# Patient Record
Sex: Female | Born: 1995
Health system: Southern US, Community
[De-identification: ages and names within clinical notes are randomized; demographics above are authoritative.]

## PROBLEM LIST (undated history)

## (undated) DIAGNOSIS — T7840XA Allergy, unspecified, initial encounter: Secondary | ICD-10-CM

## (undated) DIAGNOSIS — IMO0001 Reserved for inherently not codable concepts without codable children: Secondary | ICD-10-CM

## (undated) HISTORY — DX: Reserved for inherently not codable concepts without codable children: IMO0001

## (undated) HISTORY — DX: Allergy, unspecified, initial encounter: T78.40XA

---
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
## 2016-04-28 ENCOUNTER — Ambulatory Visit (INDEPENDENT_AMBULATORY_CARE_PROVIDER_SITE_OTHER): Payer: BLUE CROSS/BLUE SHIELD | Admitting: Family Medicine

## 2016-04-28 ENCOUNTER — Encounter: Payer: Self-pay | Admitting: Family Medicine

## 2016-04-28 VITALS — BP 100/60 | HR 90 | Wt 132.0 lb

## 2016-04-28 DIAGNOSIS — M25561 Pain in right knee: Secondary | ICD-10-CM

## 2016-04-28 NOTE — Progress Notes (Signed)
   Subjective:    Patient ID: Serena CroissantSusan Hixon, female    DOB: Jul 22, 1995, 21 y.o.   MRN: [id number]  HPI She is here for evaluation of right knee pain. She states that she has had intermittent difficulty with knee pain since age 21 but not significant enough for her to have seen help. Approximately 2 weeks ago she noted increased pain to the anterolateral area of her knee. She noted initially when she had her knees slightly bent while doing dance classes. She notes increased pain going down the hill but does not note same issue going down a hill or with sitting for long periods of time. She states she has no difficulty with jumping. She states that it does cause some popping and grinding but no locking.   Review of Systems     Objective:   Physical Exam Full motion of the right knee. No effusion noted. No tenderness over the inferior pole of the patella or the tibial tubercle. No tenderness to the medial or lateral aspect of the patella. Compression testing was negative. No joint line tenderness. Medial and lateral collateral ligaments intact. McMurray's testing did cause some slight discomfort laterally.       Assessment & Plan:  Acute pain of right knee - Plan: Ambulatory referral to Physical Therapy Some of her symptoms sound like chondromalacia possibly patellar tendon however she does have a questionable with positive lateral McMurray's. I will send her for physical therapy for good knee rehabilitation program and if continued difficulty consider ultrasound versus MRI.

## 2016-05-09 ENCOUNTER — Encounter: Payer: Self-pay | Admitting: Internal Medicine

## 2016-06-10 ENCOUNTER — Encounter: Payer: Self-pay | Admitting: Family Medicine

## 2016-06-10 ENCOUNTER — Ambulatory Visit (INDEPENDENT_AMBULATORY_CARE_PROVIDER_SITE_OTHER): Payer: BLUE CROSS/BLUE SHIELD | Admitting: Family Medicine

## 2016-06-10 VITALS — BP 100/60 | HR 69 | Temp 98.2°F | Wt 136.0 lb

## 2016-06-10 DIAGNOSIS — J019 Acute sinusitis, unspecified: Secondary | ICD-10-CM

## 2016-06-10 MED ORDER — AMOXICILLIN 875 MG PO TABS: 875.0000 mg | ORAL_TABLET | Freq: Two times a day (BID) | ORAL | 0 refills | Status: AC

## 2016-06-10 NOTE — Progress Notes (Signed)
   Subjective:    Patient ID: Shannon CroissantSusan Foutz, female    DOB: 10-31-1995, 21 y.o.   MRN: [id number]  HPI  She is here for evaluation of a four-day history of started with cough and sinus congestion. The cough is become productive over the last couple of days with worsening of sinus congestion, pressure, rhinorrhea. The rhinorrhea is now become productive. She also complains of frontal type headaches but no fever, chills, slight sore throat, PND and clearing of the throat but not really coughing. She does not smoke. Does not have underlying allergies. She is getting ready to go home for Easter and does not want to potentially infect her grandmother who has leukemia. She was seen in the past for knee pain but has not been able to get set up to go to physical therapy. He states that her knee is causing only minimal discomfort.  Review of Systems     Objective:   Physical Exam Alert and in no distress. His mucosa is normal. Tender over frontal, ethmoid and to a lesser extent maxillary sinuses Tympanic membrane on the right was slightly dull, left normal canals are normal. Pharyngeal area is normal. Neck is supple without adenopathy or thyromegaly. Cardiac exam shows a regular sinus rhythm without murmurs or gallops. Lungs are clear to auscultation.        Assessment & Plan:  Acute sinusitis, recurrence not specified, unspecified location - Plan: amoxicillin (AMOXIL) 875 MG tablet There is questionable otitis media but the Amoxil cover both. She will take the entire dosing and call if not entirely back to normal. Also cautioned about the use of condoms for sexual activity since she is also on oral contraceptive.

## 2016-06-10 NOTE — Progress Notes (Signed)
Subjective:     Patient ID: Shannon CroissantSusan Mokry, female   DOB: Oct 21, 1995, 21 y.o.   MRN: [id number]  HPI Shannon Schwartz is a 21 year old female who presents today with a cough and sinus congestion. The cough started Saturday and got worse on Sunday and she continued to feel terrible yesterday and this morning with no improvement. The cough is productive that's worse in the morning and at night.When blowing her nose this morning she noticed the mucus was bright green. She's also had a sinus headache that's constant and dull over her cheeks and above her eyes. She's been having congestion and a runny nose as well. Her throat has also felt sore and scratchy (3 out of 10 pain). She reports not being able to hear well and has pressure in her ears. She's tried to take nyquil and dayquil without improvement.    Review of Systems She reports no fever, chills, shortness of breath, nausea, vomiting, diarrhea, itchy eyes. She has no known allergies. Other pertinent positive and negatives are in the HPI.     Objective:   Physical Exam  Blood pressure 100/60, pulse 69, temperature 98.2 F (36.8 C), temperature source Oral, weight 136 lb (61.7 kg), SpO2 97 %.  On exam, there is tympanic membrane fullness and fluid behind the ear seen in the right ear. The left ear is clear and normal. She has sinus tenderness when palpating between the eyes and over the bridge of the nose. Her throat is non-erythematous and without exudate. Nasal mucosa is clear and non-erythematous. Lungs are clear to ausculation bilaterally.  Exam and HPI consistent with Acute Sinusitis.    Assessment & Plan    Acute sinusitis, recurrence not specified, unspecified location - Plan: amoxicillin (AMOXIL) 875 MG tablet  1. Acute sinusitis: Exam and presenting symptoms are consistent with acute sinusitis. Amoxicillin was prescribed. Patient is currently on oral contraceptive pills. Patient was counseled on using another method of birth control while on  the antibiotic and she expressed understanding. Patient was told to call back if she doesn't improve over the next few days.  Patient was seen in conjunction with Dr. Susann GivensLalonde.

## 2016-09-29 DIAGNOSIS — Z118 Encounter for screening for other infectious and parasitic diseases: Secondary | ICD-10-CM | POA: Diagnosis not present

## 2016-09-29 DIAGNOSIS — Z6824 Body mass index (BMI) 24.0-24.9, adult: Secondary | ICD-10-CM | POA: Diagnosis not present

## 2016-09-29 DIAGNOSIS — Z01419 Encounter for gynecological examination (general) (routine) without abnormal findings: Secondary | ICD-10-CM | POA: Diagnosis not present

## 2016-10-02 IMAGING — CR DG FOOT COMPLETE 3+V*L*
3 series · 3 of 3 positions shown · non-contrast
Comparison: None.

CLINICAL DATA: Injured the left foot today with pain in the fifth
metatarsal

EXAM:
LEFT FOOT - COMPLETE 3+ VIEW

[view not recorded (1 of 3)]
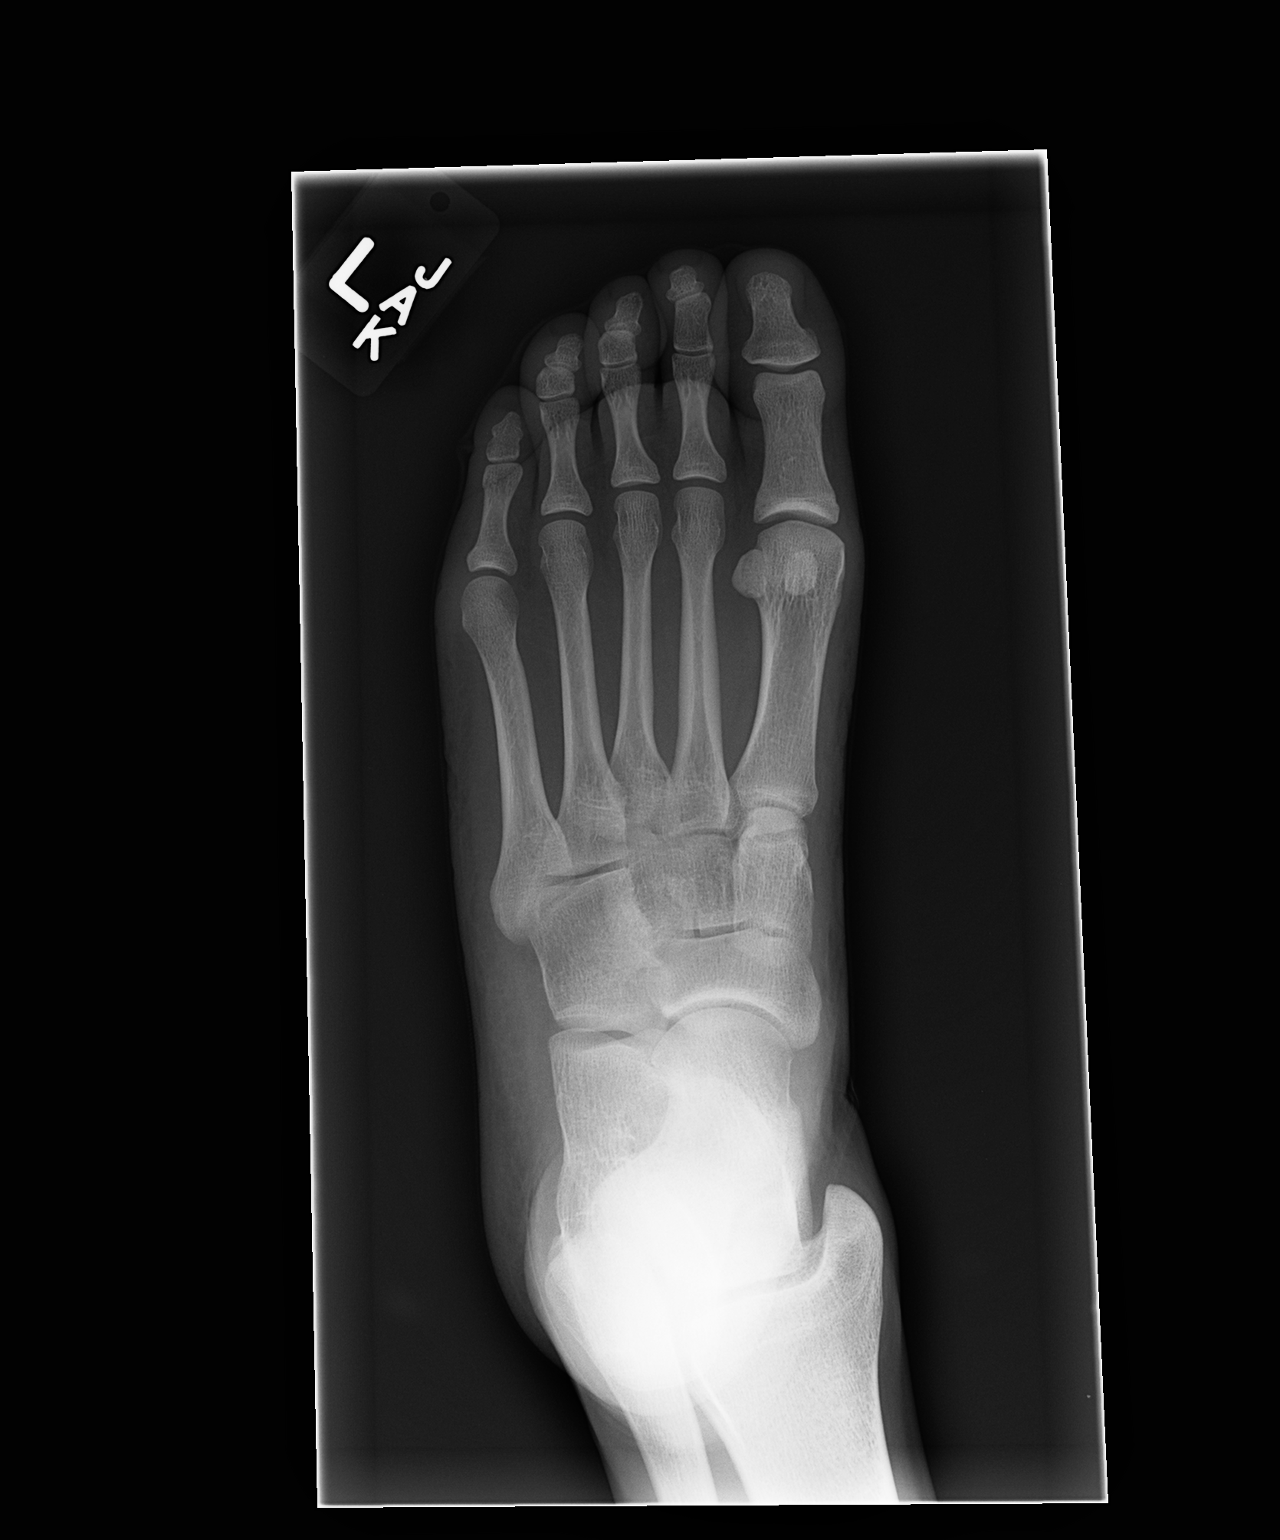

[view not recorded (2 of 3)]
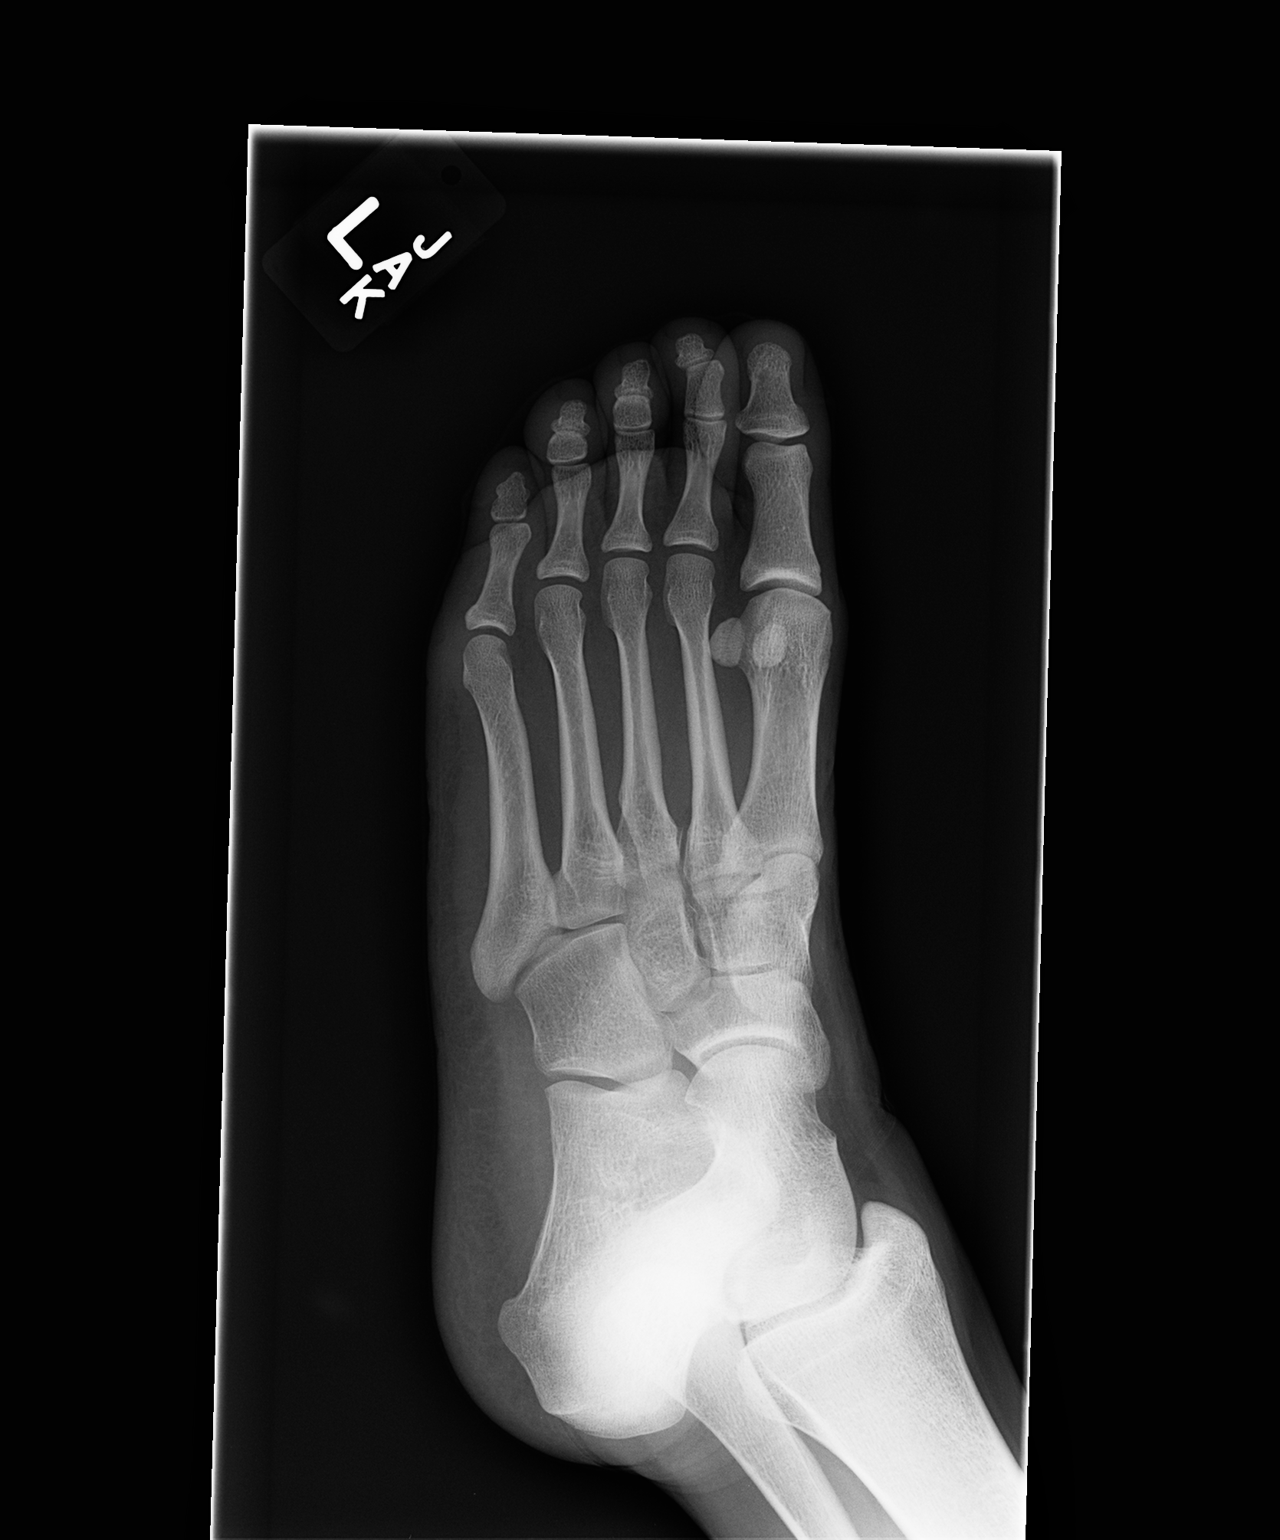

[view not recorded (3 of 3)]
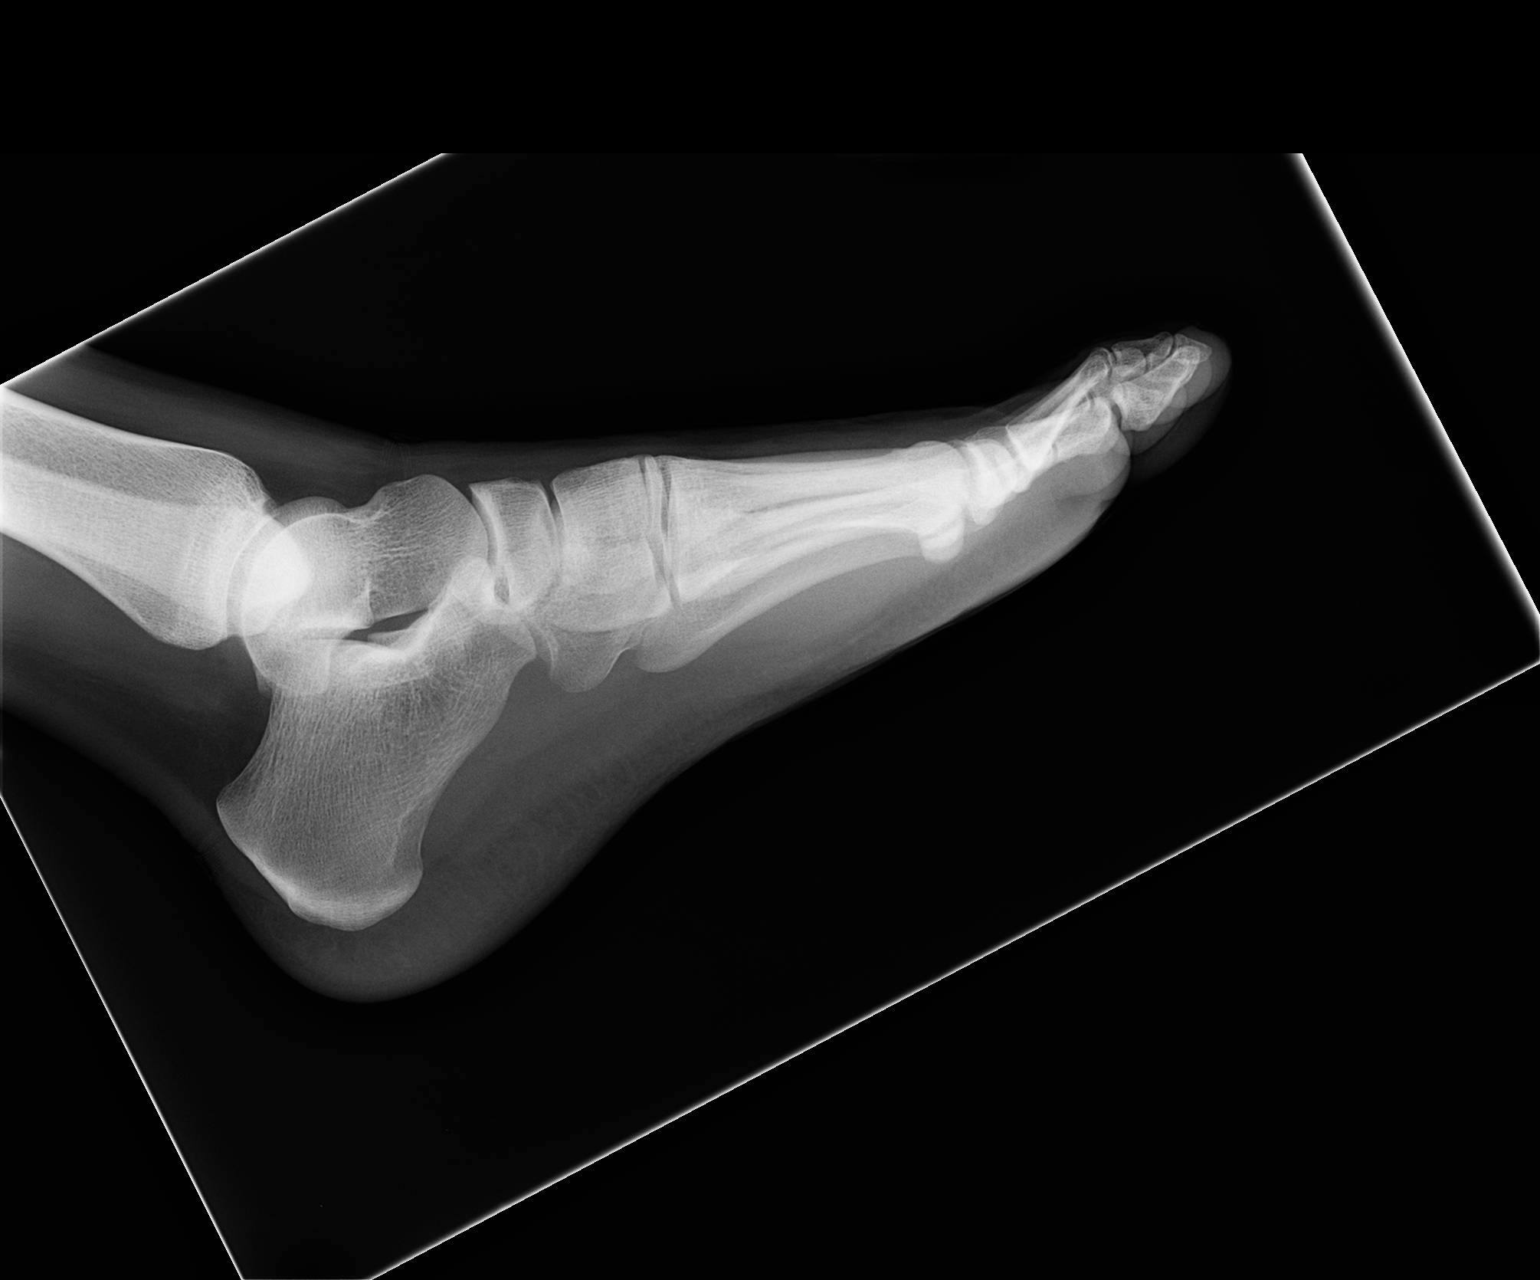

[3 of 3 positions shown; findings below may reference images not displayed]

FINDINGS: No acute fracture is seen. Tarsal -metatarsal alignment is normal.
Joint spaces appear normal.
IMPRESSION: Negative.

## 2017-06-24 ENCOUNTER — Ambulatory Visit: Payer: BLUE CROSS/BLUE SHIELD | Admitting: Family Medicine

## 2017-06-24 ENCOUNTER — Encounter: Payer: Self-pay | Admitting: Family Medicine

## 2017-06-24 VITALS — BP 122/76 | HR 89 | Temp 98.5°F | Ht 64.75 in | Wt 134.6 lb

## 2017-06-24 DIAGNOSIS — L739 Follicular disorder, unspecified: Secondary | ICD-10-CM

## 2017-06-24 MED ORDER — DOXYCYCLINE HYCLATE 100 MG PO TABS: 100.0000 mg | ORAL_TABLET | Freq: Two times a day (BID) | ORAL | 0 refills | Status: AC

## 2017-06-24 NOTE — Progress Notes (Signed)
   Subjective:    Patient ID: Shannon CroissantSusan Obenchain, female    DOB: 03-Jul-1995, 22 y.o.   MRN: [id number]  HPI She notes a 2-day history of flat erythematous lesions present on her arms and then started noticing a few on her torso and one on her back.  They are not pruritic and has not drained.   Review of Systems     Objective:   Physical Exam Alert and in no distress.  Multiple erythematous flat lesions are noted on the arms and torso.       Assessment & Plan:  Folliculitis - Plan: doxycycline (VIBRA-TABS) 100 MG tablet I explained that this is probably a folliculitis.  I did mention staph but unfortunately there is nothing to culture.  I would treat him for 10 days.  Discussed covering these lesions and staying on the antibiotic for the 10 days.  Discussed using an antiseptic soap.

## 2017-06-24 NOTE — Patient Instructions (Addendum)
Use either Lever 2000 or Dial soap Continue on your birth control pill but use some other means while you are on the antibiotic through the cycle

## 2017-06-28 ENCOUNTER — Other Ambulatory Visit: Payer: Self-pay | Admitting: Family Medicine

## 2017-06-28 MED ORDER — SULFAMETHOXAZOLE-TRIMETHOPRIM 800-160 MG PO TABS: 1.0000 | ORAL_TABLET | Freq: Two times a day (BID) | ORAL | 0 refills | Status: DC

## 2017-06-29 ENCOUNTER — Ambulatory Visit: Payer: BLUE CROSS/BLUE SHIELD | Admitting: Family Medicine

## 2017-06-29 ENCOUNTER — Encounter: Payer: Self-pay | Admitting: Family Medicine

## 2017-06-29 VITALS — BP 102/70 | HR 70 | Temp 98.0°F | Ht 65.0 in | Wt 132.6 lb

## 2017-06-29 DIAGNOSIS — L0100 Impetigo, unspecified: Secondary | ICD-10-CM | POA: Diagnosis not present

## 2017-06-29 NOTE — Progress Notes (Signed)
   Subjective:    Patient ID: Shannon CroissantSusan Schwartz, female    DOB: 1996/03/15, 22 y.o.   MRN: [id number]  HPI She is here for a recheck.  She was taking doxycycline for treatment of the skin infection however it got worse.  She called me yesterday and I switched her to Septra.  She is here today stating some lesions have started to drain.   Review of Systems     Objective:   Physical Exam Multiple lesions noted on the left side of her face and arms that are round with central slight clearing with vesicular edges.       Assessment & Plan:  Impetigo - Plan: WOUND CULTURE These now appear to be more impetiginous.  I will get a culture from this to be safe and continue her on the Septra.

## 2017-06-29 NOTE — Patient Instructions (Signed)
Use Hibiclens on your skin for the next several days

## 2017-06-30 ENCOUNTER — Ambulatory Visit: Payer: BLUE CROSS/BLUE SHIELD | Admitting: Family Medicine

## 2017-07-01 LAB — WOUND CULTURE: Organism ID, Bacteria: NONE SEEN

## 2017-07-07 ENCOUNTER — Ambulatory Visit (HOSPITAL_COMMUNITY)
Admission: EM | Admit: 2017-07-07 | Discharge: 2017-07-07 | Disposition: A | Discharge status: Home / Self Care | Payer: BLUE CROSS/BLUE SHIELD | Attending: Family Medicine | Admitting: Family Medicine

## 2017-07-07 ENCOUNTER — Encounter (HOSPITAL_COMMUNITY): Payer: Self-pay | Admitting: Emergency Medicine

## 2017-07-07 DIAGNOSIS — L500 Allergic urticaria: Secondary | ICD-10-CM

## 2017-07-07 DIAGNOSIS — T887XXA Unspecified adverse effect of drug or medicament, initial encounter: Secondary | ICD-10-CM

## 2017-07-07 DIAGNOSIS — L509 Urticaria, unspecified: Secondary | ICD-10-CM

## 2017-07-07 MED ORDER — MUPIROCIN 2 % EX OINT: 1.0000 "application " | TOPICAL_OINTMENT | Freq: Two times a day (BID) | CUTANEOUS | 0 refills | Status: AC

## 2017-07-07 MED ORDER — PREDNISONE 10 MG (21) PO TBPK: ORAL_TABLET | Freq: Every day | ORAL | 0 refills | Status: AC

## 2017-07-07 NOTE — ED Triage Notes (Signed)
Pt sts taking septra for staph infection to skin; pt sts rash and hives noted this am

## 2017-07-07 NOTE — ED Provider Notes (Signed)
[facility]   [id number] 07/07/17 Arrival Time: 1337  ASSESSMENT & PLAN:  1. Non-dose-related adverse effect of medication, initial encounter   2. Hives     Meds ordered this encounter  Medications  . mupirocin ointment (BACTROBAN) 2 %    Sig: Place 1 application into the nose 2 (two) times daily.    Dispense:  22 g    Refill:  0  . predniSONE (STERAPRED UNI-PAK 21 TAB) 10 MG (21) TBPK tablet    Sig: Take by mouth daily. Take as directed.    Dispense:  21 tablet    Refill:  0   Will f/u with PCP or here if not seeing improvement over the next 24-48 hours, sooner if needed..  Reviewed expectations re: course of current medical issues. Questions answered. Outlined signs and symptoms indicating need for more acute intervention. Patient verbalized understanding. After Visit Summary given.   SUBJECTIVE:  Serena CroissantSusan Konkel is a 22 y.o. female who presents with a skin complaint.   Location: generalized Onset: gradual Duration: began yesterday Pruritic? Yes Painful? No Progression: increasing steadily  Drainage? No  Known trigger? Questions allergic reaction to antibiotic; Bactrim Other associated symptoms: none Therapies tried thus far: none Denies fever. No specific aggravating or alleviating factors reported.  ROS: As per HPI.  OBJECTIVE: Vitals:   07/07/17 1400  BP: 125/78  Pulse: (!) 102  Resp: 16  Temp: 98.3 F (36.8 C)  TempSrc: Oral  SpO2: 100%    General appearance: alert; no distress Lungs: clear to auscultation bilaterally Heart: regular rate and rhythm Extremities: no edema Skin: warm and dry; generalized macular and urticarial-like skin eruption from neck down; no oral lesions Psychological: alert and cooperative; normal mood and affect  No Known Allergies  Past Medical History:  Diagnosis Date  . Allergy    RHINITIS  . Menarche    Social History   Socioeconomic History  . Marital status: Single    Spouse name: Not on file  .  Number of children: Not on file  . Years of education: Not on file  . Highest education level: Not on file  Occupational History  . Not on file  Social Needs  . Financial resource strain: Not on file  . Food insecurity:    Worry: Not on file    Inability: Not on file  . Transportation needs:    Medical: Not on file    Non-medical: Not on file  Tobacco Use  . Smoking status: Never Smoker  . Smokeless tobacco: Never Used  Substance and Sexual Activity  . Alcohol use: No  . Drug use: No  . Sexual activity: Not Currently  Lifestyle  . Physical activity:    Days per week: Not on file    Minutes per session: Not on file  . Stress: Not on file  Relationships  . Social connections:    Talks on phone: Not on file    Gets together: Not on file    Attends religious service: Not on file    Active member of club or organization: Not on file    Attends meetings of clubs or organizations: Not on file    Relationship status: Not on file  . Intimate partner violence:    Fear of current or ex partner: Not on file    Emotionally abused: Not on file    Physically abused: Not on file    Forced sexual activity: Not on file  Other Topics Concern  . Not on  file  Social History Narrative  . Not on file    History reviewed. No pertinent surgical history.   Mardella Layman, MD 07/11/17 1131

## 2017-07-20 ENCOUNTER — Ambulatory Visit: Payer: BLUE CROSS/BLUE SHIELD | Admitting: Family Medicine

## 2017-07-20 ENCOUNTER — Encounter: Payer: Self-pay | Admitting: Family Medicine

## 2017-07-20 VITALS — BP 110/64 | HR 70 | Temp 97.8°F | Ht 64.0 in | Wt 136.0 lb

## 2017-07-20 DIAGNOSIS — L739 Follicular disorder, unspecified: Secondary | ICD-10-CM | POA: Diagnosis not present

## 2017-07-20 MED ORDER — CLARITHROMYCIN 500 MG PO TABS: 500.0000 mg | ORAL_TABLET | Freq: Two times a day (BID) | ORAL | 0 refills | Status: AC

## 2017-07-20 NOTE — Patient Instructions (Signed)
Keep using the antibacterial soap but also use Hibiclens

## 2017-07-20 NOTE — Progress Notes (Signed)
   Subjective:    Patient ID: Shannon Schwartz, female    DOB: 1995/07/30, 22 y.o.   MRN: [id number]  HPI She has noted several small lesions scattered over her whole body that are now starting to give her trouble.  Apparently her boyfriend has a tinea infection.   Review of Systems     Objective:   Physical Exam Alert and in no distress.  Multiple erythematous follicular lesions are noted on her arms, legs and a few on her anterior chest.       Assessment & Plan:  Folliculitis - Plan: clarithromycin (BIAXIN) 500 MG tablet I explained that this is probably folliculitis however we are limited by her allergy to Septra and the fact that doxycycline did not work.  I will therefore switch her to Biaxin, have her use Hibiclens and continue with the antibacterial soap.  Hopefully this will keep it in check.

## 2017-10-30 DIAGNOSIS — Z6824 Body mass index (BMI) 24.0-24.9, adult: Secondary | ICD-10-CM | POA: Diagnosis not present

## 2017-10-30 DIAGNOSIS — Z118 Encounter for screening for other infectious and parasitic diseases: Secondary | ICD-10-CM | POA: Diagnosis not present

## 2017-10-30 DIAGNOSIS — R87612 Low grade squamous intraepithelial lesion on cytologic smear of cervix (LGSIL): Secondary | ICD-10-CM | POA: Diagnosis not present

## 2017-10-30 DIAGNOSIS — Z01419 Encounter for gynecological examination (general) (routine) without abnormal findings: Secondary | ICD-10-CM | POA: Diagnosis not present

## 2018-04-01 DIAGNOSIS — R319 Hematuria, unspecified: Secondary | ICD-10-CM | POA: Diagnosis not present

## 2018-05-20 DIAGNOSIS — J069 Acute upper respiratory infection, unspecified: Secondary | ICD-10-CM | POA: Diagnosis not present

## 2018-05-20 DIAGNOSIS — B349 Viral infection, unspecified: Secondary | ICD-10-CM | POA: Diagnosis not present

## 2018-05-22 DIAGNOSIS — J209 Acute bronchitis, unspecified: Secondary | ICD-10-CM | POA: Diagnosis not present

## 2018-05-22 DIAGNOSIS — R0989 Other specified symptoms and signs involving the circulatory and respiratory systems: Secondary | ICD-10-CM | POA: Diagnosis not present

## 2018-05-22 DIAGNOSIS — R05 Cough: Secondary | ICD-10-CM | POA: Diagnosis not present

## 2018-11-29 DIAGNOSIS — Z113 Encounter for screening for infections with a predominantly sexual mode of transmission: Secondary | ICD-10-CM | POA: Diagnosis not present

## 2018-11-29 DIAGNOSIS — R87610 Atypical squamous cells of undetermined significance on cytologic smear of cervix (ASC-US): Secondary | ICD-10-CM | POA: Diagnosis not present

## 2018-11-29 DIAGNOSIS — Z01419 Encounter for gynecological examination (general) (routine) without abnormal findings: Secondary | ICD-10-CM | POA: Diagnosis not present

## 2019-02-05 DIAGNOSIS — Z7189 Other specified counseling: Secondary | ICD-10-CM | POA: Diagnosis not present

## 2019-02-05 DIAGNOSIS — R05 Cough: Secondary | ICD-10-CM | POA: Diagnosis not present

## 2019-02-05 DIAGNOSIS — Z20828 Contact with and (suspected) exposure to other viral communicable diseases: Secondary | ICD-10-CM | POA: Diagnosis not present

## 2019-02-05 DIAGNOSIS — R509 Fever, unspecified: Secondary | ICD-10-CM | POA: Diagnosis not present

## 2019-05-30 ENCOUNTER — Ambulatory Visit (INDEPENDENT_AMBULATORY_CARE_PROVIDER_SITE_OTHER): Payer: BC Managed Care – PPO | Admitting: Family Medicine

## 2019-05-30 ENCOUNTER — Other Ambulatory Visit: Payer: Self-pay

## 2019-05-30 ENCOUNTER — Encounter: Payer: Self-pay | Admitting: Family Medicine

## 2019-05-30 VITALS — BP 116/74 | HR 78 | Temp 98.2°F | Ht 64.0 in | Wt 144.8 lb

## 2019-05-30 DIAGNOSIS — Z0000 Encounter for general adult medical examination without abnormal findings: Secondary | ICD-10-CM

## 2019-05-30 DIAGNOSIS — F341 Dysthymic disorder: Secondary | ICD-10-CM

## 2019-05-30 LAB — POCT URINALYSIS DIP (PROADVANTAGE DEVICE)
Bilirubin, UA: NEGATIVE
Blood, UA: NEGATIVE
Glucose, UA: NEGATIVE mg/dL
Ketones, POC UA: NEGATIVE mg/dL
Leukocytes, UA: NEGATIVE
Nitrite, UA: NEGATIVE
Protein Ur, POC: NEGATIVE mg/dL
Specific Gravity, Urine: 1.01
Urobilinogen, Ur: 0.2
pH, UA: 6 (ref 5.0–8.0)

## 2019-05-30 NOTE — Progress Notes (Signed)
   Subjective:    Patient ID: Shannon Schwartz, female    DOB: April 09, 1995, 24 y.o.   MRN: [id number]  HPI She is here for complete examination.  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  She is in a stable relationship and living with her boyfriend.  She has had difficulty however with feeling sad, fatigue, feeling overwhelmed, irritable, crying and excessive sleeping.  She cannot relate this to anything in particular that might be causing it. She recently saw her gynecologist for Pap and pelvic and had no difficulties there.  Family and social history is well known to me and is very stable and supportive.  She has a stepmother and stepbrother.  This family unit has been together since they were 35 or 24 years of age.  She has no history of allergies, GI or pulmonary issues.  Apparently her cycles are fairly regular.  Health maintenance and immunizations was reviewed.   Review of Systems      Negative except as above Objective:   Physical Exam Alert and in no distress. Tympanic membranes and canals are normal. Pharyngeal area is normal. Neck is supple without adenopathy or thyromegaly. Cardiac exam shows a regular sinus rhythm without murmurs or gallops. Lungs are clear to auscultation. Reflexes normal. PHQ-9 of 13.     Assessment & Plan:  Routine general medical examination at a health care facility - Plan: Lipid Panel, POCT Urinalysis DIP (Proadvantage Device), CBC with Differential/Platelet, Comprehensive metabolic panel  Dysthymia I explained that she does have symptoms of depression.  Discussed counseling versus medication.  She is not interested in medication and I agree.  I will refer her for further evaluation and counseling to help with her general wellbeing.

## 2019-05-30 NOTE — Patient Instructions (Signed)
Darryl Hyers [phone] 

## 2019-05-31 LAB — LIPID PANEL
Chol/HDL Ratio: 2.7 ratio (ref 0.0–4.4)
Cholesterol, Total: 197 mg/dL (ref 100–199)
HDL: 72 mg/dL (ref >39)
LDL Chol Calc (NIH): 106 mg/dL — ABNORMAL HIGH (ref 0–99)
Triglycerides: 106 mg/dL (ref 0–149)
VLDL Cholesterol Cal: 19 mg/dL (ref 5–40)

## 2019-05-31 LAB — CBC WITH DIFFERENTIAL/PLATELET
Basophils Absolute: 0.1 10*3/uL (ref 0.0–0.2)
Basos: 1 %
EOS (ABSOLUTE): 0.2 10*3/uL (ref 0.0–0.4)
Eos: 3 %
Hematocrit: 39.6 % (ref 34.0–46.6)
Hemoglobin: 12.2 g/dL (ref 11.1–15.9)
Immature Grans (Abs): 0 10*3/uL (ref 0.0–0.1)
Immature Granulocytes: 0 %
Lymphocytes Absolute: 2.2 10*3/uL (ref 0.7–3.1)
Lymphs: 33 %
MCH: 25.5 pg — ABNORMAL LOW (ref 26.6–33.0)
MCHC: 30.8 g/dL — ABNORMAL LOW (ref 31.5–35.7)
MCV: 83 fL (ref 79–97)
Monocytes Absolute: 0.6 10*3/uL (ref 0.1–0.9)
Monocytes: 9 %
Neutrophils Absolute: 3.6 10*3/uL (ref 1.4–7.0)
Neutrophils: 54 %
Platelets: 418 10*3/uL (ref 150–450)
RBC: 4.78 x10E6/uL (ref 3.77–5.28)
RDW: 15.6 % — ABNORMAL HIGH (ref 11.7–15.4)
WBC: 6.6 10*3/uL (ref 3.4–10.8)

## 2019-05-31 LAB — COMPREHENSIVE METABOLIC PANEL
ALT: 6 IU/L (ref 0–32)
AST: 19 IU/L (ref 0–40)
Albumin/Globulin Ratio: 2 (ref 1.2–2.2)
Albumin: 4.2 g/dL (ref 3.9–5.0)
Alkaline Phosphatase: 44 IU/L (ref 39–117)
BUN/Creatinine Ratio: 5 — ABNORMAL LOW (ref 9–23)
BUN: 4 mg/dL — ABNORMAL LOW (ref 6–20)
Bilirubin Total: 0.6 mg/dL (ref 0.0–1.2)
CO2: 24 mmol/L (ref 20–29)
Calcium: 9.1 mg/dL (ref 8.7–10.2)
Chloride: 105 mmol/L (ref 96–106)
Creatinine, Ser: 0.77 mg/dL (ref 0.57–1.00)
GFR calc Af Amer: 127 mL/min/{1.73_m2} (ref >59)
GFR calc non Af Amer: 110 mL/min/{1.73_m2} (ref >59)
Globulin, Total: 2.1 g/dL (ref 1.5–4.5)
Glucose: 94 mg/dL (ref 65–99)
Potassium: 4.7 mmol/L (ref 3.5–5.2)
Sodium: 143 mmol/L (ref 134–144)
Total Protein: 6.3 g/dL (ref 6.0–8.5)

## 2019-09-01 DIAGNOSIS — F331 Major depressive disorder, recurrent, moderate: Secondary | ICD-10-CM | POA: Diagnosis not present

## 2019-09-06 DIAGNOSIS — F331 Major depressive disorder, recurrent, moderate: Secondary | ICD-10-CM | POA: Diagnosis not present

## 2019-09-13 DIAGNOSIS — F331 Major depressive disorder, recurrent, moderate: Secondary | ICD-10-CM | POA: Diagnosis not present

## 2019-09-20 DIAGNOSIS — F331 Major depressive disorder, recurrent, moderate: Secondary | ICD-10-CM | POA: Diagnosis not present

## 2019-09-27 DIAGNOSIS — F331 Major depressive disorder, recurrent, moderate: Secondary | ICD-10-CM | POA: Diagnosis not present

## 2019-10-04 DIAGNOSIS — F331 Major depressive disorder, recurrent, moderate: Secondary | ICD-10-CM | POA: Diagnosis not present

## 2019-10-05 DIAGNOSIS — J069 Acute upper respiratory infection, unspecified: Secondary | ICD-10-CM | POA: Diagnosis not present

## 2019-10-11 DIAGNOSIS — F331 Major depressive disorder, recurrent, moderate: Secondary | ICD-10-CM | POA: Diagnosis not present

## 2019-10-18 DIAGNOSIS — F331 Major depressive disorder, recurrent, moderate: Secondary | ICD-10-CM | POA: Diagnosis not present

## 2019-10-25 DIAGNOSIS — F331 Major depressive disorder, recurrent, moderate: Secondary | ICD-10-CM | POA: Diagnosis not present

## 2019-11-01 DIAGNOSIS — F331 Major depressive disorder, recurrent, moderate: Secondary | ICD-10-CM | POA: Diagnosis not present

## 2019-11-08 DIAGNOSIS — F331 Major depressive disorder, recurrent, moderate: Secondary | ICD-10-CM | POA: Diagnosis not present

## 2019-11-15 DIAGNOSIS — F331 Major depressive disorder, recurrent, moderate: Secondary | ICD-10-CM | POA: Diagnosis not present

## 2019-11-22 DIAGNOSIS — F331 Major depressive disorder, recurrent, moderate: Secondary | ICD-10-CM | POA: Diagnosis not present

## 2019-12-06 DIAGNOSIS — F331 Major depressive disorder, recurrent, moderate: Secondary | ICD-10-CM | POA: Diagnosis not present

## 2019-12-08 DIAGNOSIS — M26621 Arthralgia of right temporomandibular joint: Secondary | ICD-10-CM | POA: Diagnosis not present

## 2019-12-08 DIAGNOSIS — R6884 Jaw pain: Secondary | ICD-10-CM | POA: Diagnosis not present

## 2019-12-10 DIAGNOSIS — T7840XA Allergy, unspecified, initial encounter: Secondary | ICD-10-CM | POA: Diagnosis not present

## 2019-12-12 DIAGNOSIS — Z01419 Encounter for gynecological examination (general) (routine) without abnormal findings: Secondary | ICD-10-CM | POA: Diagnosis not present

## 2019-12-12 DIAGNOSIS — N930 Postcoital and contact bleeding: Secondary | ICD-10-CM | POA: Diagnosis not present

## 2019-12-12 DIAGNOSIS — R87610 Atypical squamous cells of undetermined significance on cytologic smear of cervix (ASC-US): Secondary | ICD-10-CM | POA: Diagnosis not present

## 2019-12-12 DIAGNOSIS — Z113 Encounter for screening for infections with a predominantly sexual mode of transmission: Secondary | ICD-10-CM | POA: Diagnosis not present

## 2019-12-13 DIAGNOSIS — F331 Major depressive disorder, recurrent, moderate: Secondary | ICD-10-CM | POA: Diagnosis not present

## 2019-12-26 DIAGNOSIS — L089 Local infection of the skin and subcutaneous tissue, unspecified: Secondary | ICD-10-CM | POA: Diagnosis not present

## 2020-01-02 DIAGNOSIS — B354 Tinea corporis: Secondary | ICD-10-CM | POA: Diagnosis not present

## 2020-02-21 DIAGNOSIS — Z3202 Encounter for pregnancy test, result negative: Secondary | ICD-10-CM | POA: Diagnosis not present

## 2020-02-21 DIAGNOSIS — R87619 Unspecified abnormal cytological findings in specimens from cervix uteri: Secondary | ICD-10-CM | POA: Diagnosis not present

## 2020-02-21 DIAGNOSIS — N870 Mild cervical dysplasia: Secondary | ICD-10-CM | POA: Diagnosis not present
